# Patient Record
Sex: Male | Born: 1965 | Race: White | Hispanic: No | Smoking: Former smoker
Health system: Southern US, Community
[De-identification: ages and names within clinical notes are randomized; demographics above are authoritative.]

## PROBLEM LIST (undated history)

## (undated) DIAGNOSIS — F419 Anxiety disorder, unspecified: Secondary | ICD-10-CM

## (undated) DIAGNOSIS — M419 Scoliosis, unspecified: Secondary | ICD-10-CM

## (undated) DIAGNOSIS — I341 Nonrheumatic mitral (valve) prolapse: Secondary | ICD-10-CM

## (undated) DIAGNOSIS — T7840XA Allergy, unspecified, initial encounter: Secondary | ICD-10-CM

## (undated) DIAGNOSIS — F329 Major depressive disorder, single episode, unspecified: Secondary | ICD-10-CM

## (undated) DIAGNOSIS — F32A Depression, unspecified: Secondary | ICD-10-CM

## (undated) HISTORY — DX: Major depressive disorder, single episode, unspecified: F32.9

## (undated) HISTORY — DX: Depression, unspecified: F32.A

## (undated) HISTORY — DX: Allergy, unspecified, initial encounter: T78.40XA

## (undated) HISTORY — DX: Anxiety disorder, unspecified: F41.9

---

## 2001-10-09 ENCOUNTER — Encounter: Admission: RE | Admit: 2001-10-09 | Discharge: 2001-10-09 | Payer: Self-pay | Admitting: Gastroenterology

## 2001-10-09 ENCOUNTER — Encounter: Payer: Self-pay | Admitting: Gastroenterology

## 2001-10-19 ENCOUNTER — Encounter: Payer: Self-pay | Admitting: Emergency Medicine

## 2001-10-19 ENCOUNTER — Emergency Department (HOSPITAL_COMMUNITY): Admission: EM | Admit: 2001-10-19 | Discharge: 2001-10-19 | Payer: Self-pay | Admitting: Emergency Medicine

## 2002-08-18 ENCOUNTER — Ambulatory Visit (HOSPITAL_COMMUNITY): Admission: RE | Admit: 2002-08-18 | Discharge: 2002-08-18 | Payer: Self-pay | Admitting: Gastroenterology

## 2002-12-02 HISTORY — PX: SPINAL FUSION: SHX223

## 2003-11-09 ENCOUNTER — Inpatient Hospital Stay (HOSPITAL_COMMUNITY): Admission: RE | Admit: 2003-11-09 | Discharge: 2003-11-14 | Payer: Self-pay | Admitting: Orthopaedic Surgery

## 2004-03-21 ENCOUNTER — Emergency Department (HOSPITAL_COMMUNITY): Admission: EM | Admit: 2004-03-21 | Discharge: 2004-03-21 | Payer: Self-pay | Admitting: Emergency Medicine

## 2004-04-11 ENCOUNTER — Encounter: Admission: RE | Admit: 2004-04-11 | Discharge: 2004-07-10 | Payer: Self-pay | Admitting: Orthopaedic Surgery

## 2004-05-16 ENCOUNTER — Encounter: Admission: RE | Admit: 2004-05-16 | Discharge: 2004-08-14 | Payer: Self-pay | Admitting: Orthopaedic Surgery

## 2004-07-31 ENCOUNTER — Encounter: Admission: RE | Admit: 2004-07-31 | Discharge: 2004-10-29 | Payer: Self-pay | Admitting: Orthopaedic Surgery

## 2011-04-19 NOTE — Op Note (Signed)
NAME:  Edwin Goodwin, Edwin Goodwin                        ACCOUNT NO.:  [id number]   MEDICAL RECORD NO.:  [id number]                   PATIENT TYPE:  INP   LOCATION:  1610                                 FACILITY:   PHYSICIAN:  Sharolyn Douglas, M.D.                     DATE OF BIRTH:  [birth year]   DATE OF PROCEDURE:  11/09/2003  DATE OF DISCHARGE:                                 OPERATIVE REPORT   PREOPERATIVE DIAGNOSIS:  Idiopathic adult scoliosis.   POSTOPERATIVE DIAGNOSIS:  Idiopathic adult scoliosis.   OPERATION PERFORMED:  1. Posterior spinal fusion, T4 through L1.  2. Segmental spinal instrumentation, T4 through L1 utilizing hooks, screws     and wires from the [employer], stainless steel and Compass system.  3. Left posterior iliac crest bone graft supplemented with local autogenous     bone graft and allograft.  4. Neuro monitoring utilizing somatosensory evoked potentials and motor     evoked potentials.   SURGEON:  Sharolyn Douglas, M.D.   ASSISTANT:  Verlin Fester, P.A.   ANESTHESIA:  General endotracheal.   ESTIMATED BLOOD LOSS:  200 mL.   COMPLICATIONS:  None.   INDICATIONS FOR PROCEDURE:  The patient is a 45 year old male with a history  of idiopathic scoliosis.  He has had persistent pain along the thoracic  curve.  He has failed conservative treatment measures.  His radiographs show  a 50 plus degree right thoracic curve, measured from T4 to T12.  There was a  compensatory left lumbar curve.  Because of his ongoing pain and spinal  deformity, he has elected to undergo posterior spinal arthrodesis with  instrumentation and iliac crest bone graft in hopes of improving his  symptoms, partially correcting his deformity and preventing further  progression.  The risks, benefits, alternatives and complications of the  procedure were extensively discussed with the patient and he elected to  proceed.   DESCRIPTION OF PROCEDURE:  The patient was properly identified in the  holding  area and taken to the operating room.  He underwent general  endotracheal anesthesia.  He was given prophylactic IV antibiotics.  He was  carefully turned prone onto the Acromed 4-poster positioning frame.  All  bony prominences were padded.  Face and eyes protected at all times.  Back  prepped and draped in the usual sterile fashion.  An incision was made from  T3 to L2 in the midline. Dissection was carried down through the deep fascia  and the dorsal spine was exposed using subperiosteal dissection out to the  tips of the transverse processes from T4 down to L1.  The bony anatomy was  defined.  Retractors were placed.  We then removed the spinous processes  from T4 through T12 using the bone biting rongeur.  This bone was saved for  later autografting.  At this point we turned our attention to collecting  bone from the left  posterior iliac crest.  A separate incision was made.  The deep fascia was incised and the posterior superior iliac spine exposed.  A Leksell rongeur was used to remove the cap of the __________.  We then  removed copious amounts of cancellous bone from between the iliac cables  using curets and gouges.  The deep fascia was then repaired with #1 Vicryl.  The subcutaneous layer closed with 2-0 Vicryl and the skin closed with a  running 3-0 subcuticular Vicryl suture.  Benzoin and Steri-Strips were  placed.  We then turned our attention back to the midline incision.  We  performed facetectomies bilaterally from T4 through the T12-L1 facet joints.  The facet joints were burred.  We then placed pedicle screws at T12 and L1  bilaterally. We used 6.5 x 40 mm screws at T12 bilaterally.  At L1 we used a  6.5 by 40 screw on the left and a 5.5 x 40 screw on the right.  We then  placed hooks over the transverse processes of T4 bilaterally in a down going  fashion.  Pedicle hooks were placed at T5 bilaterally.  A periapical claw  construct was created at T8-9 on the right side  using up and downgoing  transverse process hooks.  We then cut a rod to length and carefully bent it  into normal sagittal plane kyphosis and lordosis.  We then turned our  attention to preparing laminotomies at T6-7, T7-8, T8-9 and T9-10 in order  to place sublaminar wires.  Four sublaminar wires were passed in the apical  region of the curve.  Great care was taken to protect the underlying spinal  cord.  We monitored somatosensory evoked potentials and motor evoked  potentials throughout the procedure.  There were no deleterious changes.  We  then placed the left-sided rod into the claw construct proximally and  attached it to the pedicle screws distally.  The rod was rotated in the  normal sagittal plane alignment.  We then tightened the sublaminar wires,  obtaining a good reduction of the thoracic curve.  We then placed the right-  sided rod after bending it again into the normal sagittal plane alignment.  The claw constructs were seated proximally.  We applied compression about  the right-sided periapical claw.  We distracted and compressed on the  pedicle screws to level the caudal foundation.  We placed cross connectors  proximally and distally.  At this point we turned our attention to  performing a posterior spinal arthrodesis, T4 through L1.  The lamina and  transverse processes were decorticated using the high speed bur. The  posterior iliac crest bone graft was packed into the facet joints at each  level.  The local autogenous bone graft which had been cleaned and  morselized was then placed over the lamina.  We then supplemented this with  100 mL of allograft bone croutons.  AP and lateral intraoperative x-ray  showed appropriate positioning of instrumentation and reduction of the  deformity. We then closed the deep layer using a #1 Vicryl suture,  subcutaneous layer closed with 2-0 Vicryl followed by a running 3-0 subcuticular Vicryl suture to approximate the skin.  A  superficial Hemovac  drain was placed.  Benzoin and Steri-Strips applied.  Sterile dressing  placed over the patient's wound.  He was turned supine, extubated without  difficulty and transferred to the recovery room in stable condition able to  move his upper and lower extremities.  It should be noted  that there were no  deleterious changes in the SSEP's or motor evoked potentials throughout the  procedure.                                               Sharolyn Douglas, M.D.    MC/MEDQ  D:  11/10/2003  T:  11/11/2003  Job:  [id number]

## 2011-04-19 NOTE — Op Note (Signed)
   NAME:  Edwin Goodwin, Edwin Goodwin                        ACCOUNT NO.:  [id number]   MEDICAL RECORD NO.:  [id number]                   PATIENT TYPE:  AMB   LOCATION:  ENDO                                 FACILITY:  [facility]   PHYSICIAN:  Barrie Folk, M.D.                  DATE OF BIRTH:  12-07-1965   DATE OF PROCEDURE:  08/18/2002  DATE OF DISCHARGE:                                 OPERATIVE REPORT   PROCEDURE:  Colonoscopy.   INDICATIONS FOR PROCEDURE:  Heme positive stool discovered during evaluation  for chronic constipation.   DESCRIPTION OF PROCEDURE:  The patient was placed in the left lateral  decubitus position then placed on the pulse monitor with continuous low flow  oxygen delivered by nasal cannula. He was sedated with 100 mg IV Demerol and  10 mg IV Versed. The Olympus video colonoscope was inserted into the rectum  and advanced to the cecum, confirmed by transillumination at McBurney's  point and visualization of the ileocecal valve and appendiceal orifice. The  prep was generally good but suboptimal in the ascending colon and cecum and  I could not rule out small lesions less than 1 cm in all areas. Otherwise,  the cecum, ascending, transverse, descending and sigmoid colon all appeared  normal with no masses, polyps, diverticula or other mucosal abnormalities.  The rectum likewise appeared normal and retroflexed view of the anus  revealed some small internal hemorrhoids. The colonoscope was then withdrawn  and the patient returned to the recovery room in stable condition. The  patient tolerated the procedure well and there were no immediate  complications.   IMPRESSION:  Internal hemorrhoids, otherwise, normal colonoscopy.   PLAN:  Begin a regimen of Zelnorm and tailored dose of MiraLax for his  chronic constipation and follow-up in the office in a few weeks.                                               Barrie Folk, M.D.    JCH/MEDQ  D:  08/18/2002  T:  08/19/2002   Job:  [id number]

## 2011-04-19 NOTE — Discharge Summary (Signed)
NAME:  Edwin Goodwin, Edwin Goodwin                        ACCOUNT NO.:  [id number]   MEDICAL RECORD NO.:  [id number]                   PATIENT TYPE:  INP   LOCATION:  5030                                 FACILITY:  [facility]   PHYSICIAN:  Sharolyn Douglas, M.D.                     DATE OF BIRTH:  16-Sep-1966   DATE OF ADMISSION:  11/09/2003  DATE OF DISCHARGE:  11/14/2003                                 DISCHARGE SUMMARY   ADMISSION DIAGNOSES:  1. Idiopathic scoliosis.  2. Irritable bowel syndrome.  3. Mitral valve prolapse.  4. History of urinary tract infections.  5. Panic disorder.   DISCHARGE DIAGNOSES:  1. Status post T4 to L1 posterior spinal fusion with pedicle screws and left     posterior iliac crest bone grafting.  2. Low-grade temperature postoperatively.  3. Postoperative hemorrhagic anemia which was asymptomatic and did not     require transfusions.  4. Idiopathic scoliosis.  5. Irritable bowel syndrome.  6. Mitral valve prolapse.  7. History of urinary tract infections.  8. Panic disorder.   PROCEDURE:  1. Posterior spinal fusion from T4 to L1 with pedicle screws, hooks, and     wires.  2. Left posterior iliac crest bone grafting.  Surgeon was Sharolyn Douglas, MD.     Assistant was [facility], PA-C.  General anesthesia was used.   CONSULTS:  None.   LABS:  Preoperative labs, CBC with diff was within normal limits with the  exception of platelets of 409,000, slightly elevated.  PT/INR and PTT within  normal limits.  Complete metabolic panel within normal limits with the  exception of glucose of 121.  UA was negative.  Blood typing was type A, Rh  positive, antibody screen negative.  H&H were monitored postoperatively  throughout the hospital stay and reached a low of 9.3 and 27.9 respectively;  however, was asymptomatic and did not require transfusion.  Basic metabolic  panel postoperatively within normal limits with the exception of slight  elevation of glucose at 163 and 129 on  December and December 10  respectively.  Potassium was slightly decreased at 3.3 on December 10.   X-RAYS:  December 8 portable x-rays were used for localization.  On December  13 thoracolumbar x-ray showed T4 to L1 posterior spinal fusion and good  position and alignment.  EKG from December 3 showed sinus tachycardia,  otherwise normal ready by Nicki Guadalajara.   BRIEF HISTORY:  The patient is a 45 year old male who has had idiopathic  scoliosis since he was an adolescent.  Unfortunately, he has developed some  significant pain in his back from this dysfunction over the last few years.  The treatment as an adolescent included bracing from the age of 76 to 53.  Recently he has also developed severe headaches with migraines and has tried  numerous medications.  Also was seen by a specialist to help relieve these  symptoms.  At this point he is having aching pain that involves his head as  well as his entire back and spine area.  Primary focus of the back pain is  the right thoracic area.  He denies any tingling or numbness.  Because of  these findings as well as his x-ray findings, it was felt that his best  course of management would be posterior spinal fusion for his scoliosis.  The risks and benefits of the procedure were discussed with the patient and  the patient's family by Dr. Noel Gerold as well as myself.  They indicated  understanding and opted to proceed with the surgery.   HOSPITAL COURSE:  On November 09, 2003, the patient was taken to the  operating room for the above-listed procedure. He tolerated the procedure  well without any intraoperative complications. There was approximately 200  mL of blood loss. One Hemovac drain was placed intraoperatively.  He was  transferred to the recovery room in stable condition.   Postoperatively routine orthopedic spine protocol was followed. He  progressed well.  Physical therapy and occupational therapy began seeing him  and getting him out of  bed the first day postoperatively.  He did well with  them, although his progress was slow initially.  Over postoperative day 3 to  4, though his progress was excellent with the therapist.  He did develop a  low-grade temperature on postoperative day 2 of 100.1.  This resolved with  increased activity and increased use of incentive spirometer.  He did  develop anemia reaching a low of 9.3 as previously dictated; however, he was  asymptomatic and did not require any blood transfusion for this.  Discharge  planners were consulted as well to see the patient and help [place] in arranging  home needs as well as home health physical therapy and occupational therapy.   By November 14, 2003, the patient had met all orthopedic goals.  He was  stable and safe per the therapists.  He was medically stable and ready for  discharge.  His vital signs  were stable.  He was afebrile.  Motor and  sensation were intact.  Incisions looked great without any signs or symptoms  of infection.  His abdomen was soft, nontender, nondistended.   DISCHARGE PLAN:  The patient is a 45 year old male status post T4 to L1  posterior spinal fusion.  Activity is daily ambulation with brace to be used  on an as-needed basis only.  Back precautions were discussed and understood.  He should avoid any lifting greater than 5 pounds.  He may shower.  Follow  up in 2 weeks with Dr. Noel Gerold.  Instructed to call for an appointment.   DISCHARGE MEDICATIONS:  Mepergan, Robaxin, over-the-counter laxative as  needed, stool softener as needed, calcium daily, and multivitamin daily.   DIET:  Regular home diet.   CONDITION ON DISCHARGE:  Stable, improved.   DISPOSITION:  The patient is being discharged to his home with his family's  assistance as well as home health, physical therapy, and occupational  therapy per [place].      Verlin Fester, P.A.                       Sharolyn Douglas, M.D.   CM/MEDQ  D:  12/21/2003  T:  12/21/2003  Job:   [id number]

## 2011-04-19 NOTE — H&P (Signed)
NAME:  Edwin Goodwin, Edwin Goodwin                        ACCOUNT NO.:  [id number]   MEDICAL RECORD NO.:  [id number]                   PATIENT TYPE:  INP   LOCATION:  6704                                 FACILITY:  [facility]   PHYSICIAN:  Sharolyn Douglas, M.D.                     DATE OF BIRTH:  December 12, 1965   DATE OF ADMISSION:  11/09/2003  DATE OF DISCHARGE:                                HISTORY & PHYSICAL   CHIEF COMPLAINT:  Scoliosis with back pain.   HISTORY OF PRESENT ILLNESS:  The patient is a 45 year old male who has had  idiopathic scoliosis since he was an adolescent.  Unfortunately, he has  developed some significant pain in his back that is limiting his function  and activities over the last few years.  Treatments as an adolescent  included bracing from the age of 65 to 55.  Recently, he has developed  severe migraine headaches and tried numerous medications, and also seen by a  specialist for this, and he has not had any relief of these symptoms.  He  has an aching pain that starts in his head and also involves his entire  spine and back area.  The primary focus of the back pain is the right  thoracic area.  He denies any tingling or numbness.  Secondary to these  findings and he has failed all conservative treatments, it is felt that his  best course of management would be a posterior spinal fusion for scoliosis.  Risks and benefits of this procedure were discussed with the patient and his  family by Dr. Noel Gerold as well as myself.  He indicated understanding and opted  to proceed.   ALLERGIES:  No known drug allergies.   MEDICATIONS:  1. Lexapro 30 mg q. day.  2. Wellbutrin XL 300 q. day.  3. Xanax 1.5 q. day p.r.n.  4. Promethazine 25 mg q.6h. p.r.n. nausea.  5. Levsin 10 mg p.r.n. stomach cramps.  6. MiraLax 17 g q. day for constipation.  7. Vicodin p.r.n.   PAST MEDICAL HISTORY:  1. Irritable bowel syndrome.  2. Mitral valve prolapse.  3. 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  4. History of panic disorders and anxiety.  5. Migraines.   PAST SURGICAL HISTORY:  None.   SOCIAL HISTORY:  The patient denies tobacco use, denies alcohol use.  He is  married.  Has one 45-year-old son.  His mother and his wife will be available  to help assist in the postoperative course.   FAMILY HISTORY:  Significant for coronary artery disease, diabetes, cancer,  stroke, and osteoarthritis.   REVIEW OF SYSTEMS:  The patient denies fevers, chills, sweats, or bleeding  tendencies.  CNS:  Denies blurry vision, double vision, seizures, headaches,  paralysis.  CARDIOVASCULAR:  Denies chest pain, angina, orthopnea,  claudication, or palpitations.  PULMONARY:  Denies shortness of breath,  productive cough, or hemoptysis.  GASTROINTESTINAL:  Denies nausea,  vomiting, does have constipation from pain medications.  Denies melena or  blood in stool.  GENITOURINARY:  Denies dysuria, hematuria, discharge.  MUSCULOSKELETAL:  As per HPI.   PHYSICAL EXAMINATION:  VITAL SIGNS:  Pulse is 88 and regular, respirations  16 and unlabored.  GENERAL APPEARANCE:  The patient is a 45 year old white male who is alert  and oriented, in no acute distress.  He is well-developed, well-groomed,  appears to be stated age.  Pleasant and cooperative to examiner.  HEENT:  Head is normocephalic, atraumatic.  Pupils equal, round, reactive to  light.  Extraocular movements were intact.  Nares patent.  Pharynx clear.  NECK:  Soft to palpation, no lymphadenopathy, thyromegaly, or bruits  appreciated.  CHEST:  Clear to auscultation bilaterally.  No rales, rhonchi, stridor,  wheezes, or friction rubs present.  HEART:  S1 and S2, regular rate and rhythm, no murmurs, rubs, or gallops  noted today.  ABDOMEN:  Soft to palpation, nontender, nondistended, no organomegaly noted.  Positive bowel sounds throughout.  GENITOURINARY:  Not pertinent, not performed.  EXTREMITIES:  Lower function and sensation are  grossly intact.  Pulses are  intact and symmetric bilaterally.  Calves are soft and nontender  bilaterally.  SKIN:  Intact without any rashes or lesions.   LABORATORY DATA:  X-ray and MRI show scoliosis.   IMPRESSION:  1. Scoliosis.  2. Irritable bowel syndrome.  3. Mitral valve prolapse.  4. History of urinary tract infection.  5. Panic disorder.   PLAN:  Admit to [facility] on November 09, 2003, for a T4 to L1  posterior spinal fusion utilizing pedicle  screws, hooks, and wires.  This will be done by Dr. Sharolyn Douglas.      Verlin Fester, P.A.                       Sharolyn Douglas, M.D.    CM/MEDQ  D:  11/09/2003  T:  11/09/2003  Job:  [id number]

## 2011-10-14 ENCOUNTER — Emergency Department (INDEPENDENT_AMBULATORY_CARE_PROVIDER_SITE_OTHER)
Admission: EM | Admit: 2011-10-14 | Discharge: 2011-10-14 | Disposition: A | Discharge status: Home / Self Care | Payer: Managed Care, Other (non HMO) | Source: Home / Self Care | Attending: Emergency Medicine | Admitting: Emergency Medicine

## 2011-10-14 ENCOUNTER — Encounter: Payer: Self-pay | Admitting: *Deleted

## 2011-10-14 DIAGNOSIS — J069 Acute upper respiratory infection, unspecified: Secondary | ICD-10-CM

## 2011-10-14 HISTORY — DX: Nonrheumatic mitral (valve) prolapse: I34.1

## 2011-10-14 HISTORY — DX: Scoliosis, unspecified: M41.9

## 2011-10-14 MED ORDER — AMOXICILLIN 875 MG PO TABS: 875.0000 mg | ORAL_TABLET | Freq: Two times a day (BID) | ORAL | Status: AC

## 2011-10-14 NOTE — ED Provider Notes (Signed)
History     CSN: [id number] Arrival date & time: 10/14/2011  8:14 AM   None     No chief complaint on file.   (Consider location/radiation/quality/duration/timing/severity/associated sxs/prior treatment) HPI Rohit is a 45 y.o. male who complains of onset of cold symptoms for 5 days and still getting worse.  They are using OTC cold meds which helps a little bit. No sore throat + cough No pleuritic pain No wheezing +nasal congestion + post-nasal drainage + sinus pain/pressure No chest congestion No itchy/red eyes No earache No hemoptysis No SOB +chills/sweats No fever No nausea No vomiting No abdominal pain No diarrhea No skin rashes + fatigue No myalgias + headache    No past medical history on file.  No past surgical history on file.  No family history on file.  History  Substance Use Topics  . Smoking status: Not on file  . Smokeless tobacco: Not on file  . Alcohol Use: Not on file      Review of Systems  Allergies  Review of patient's allergies indicates not on file.  Home Medications  No current outpatient prescriptions on file.  There were no vitals taken for this visit.  Physical Exam  Nursing note and vitals reviewed. Constitutional: He is oriented to person, place, and time. He appears well-developed and well-nourished.  HENT:  Head: Normocephalic and atraumatic.  Right Ear: Tympanic membrane, external ear and ear canal normal.  Left Ear: Tympanic membrane, external ear and ear canal normal.  Nose: Mucosal edema and rhinorrhea present.  Mouth/Throat: Posterior oropharyngeal erythema present. No oropharyngeal exudate or posterior oropharyngeal edema.  Neck: Neck supple.  Cardiovascular: Regular rhythm and normal heart sounds.   Pulmonary/Chest: Effort normal and breath sounds normal. No respiratory distress.  Neurological: He is alert and oriented to person, place, and time.  Skin: Skin is warm and dry.  Psychiatric: He has a normal  mood and affect. His speech is normal.    ED Course  Procedures (including critical care time)  Labs Reviewed - No data to display No results found.   No diagnosis found.    MDM  Viral URI   1)  Take the prescribed antibiotic as instructed.  Can hold a few days prior to taking. 2)  Use nasal saline solution (over the counter) at least 3 times a day. 3)  Use over the counter decongestants like Zyrtec-D every 12 hours as needed to help with congestion.  If you have hypertension, do not take medicines with sudafed.  4)  Can take tylenol every 6 hours or motrin every 8 hours for pain or fever. 5)  Follow up with your primary doctor if no improvement in 5-7 days, sooner if increasing pain, fever, or new symptoms.     Lily Kocher, MD 10/14/11 [phone]

## 2015-01-26 ENCOUNTER — Ambulatory Visit (INDEPENDENT_AMBULATORY_CARE_PROVIDER_SITE_OTHER): Payer: BLUE CROSS/BLUE SHIELD | Admitting: Physician Assistant

## 2015-01-26 VITALS — BP 118/70 | HR 100 | Temp 98.4°F | Resp 18 | Ht 69.0 in | Wt 165.0 lb

## 2015-01-26 DIAGNOSIS — G43909 Migraine, unspecified, not intractable, without status migrainosus: Secondary | ICD-10-CM | POA: Insufficient documentation

## 2015-01-26 DIAGNOSIS — G8929 Other chronic pain: Secondary | ICD-10-CM | POA: Insufficient documentation

## 2015-01-26 DIAGNOSIS — M549 Dorsalgia, unspecified: Secondary | ICD-10-CM

## 2015-01-26 DIAGNOSIS — I341 Nonrheumatic mitral (valve) prolapse: Secondary | ICD-10-CM | POA: Insufficient documentation

## 2015-01-26 DIAGNOSIS — J069 Acute upper respiratory infection, unspecified: Secondary | ICD-10-CM

## 2015-01-26 MED ORDER — IPRATROPIUM BROMIDE 0.03 % NA SOLN: 2.0000 | Freq: Two times a day (BID) | NASAL | Status: AC

## 2015-01-26 MED ORDER — GUAIFENESIN ER 1200 MG PO TB12: 1.0000 | ORAL_TABLET | Freq: Two times a day (BID) | ORAL | Status: AC | PRN

## 2015-01-26 NOTE — Patient Instructions (Signed)
Get plenty of rest and drink at least 64 ounces of water daily. 

## 2015-01-26 NOTE — Progress Notes (Signed)
  Medical screening examination/treatment/procedure(s) were performed by non-physician practitioner and as supervising physician I was immediately available for consultation/collaboration.     

## 2015-01-26 NOTE — Progress Notes (Signed)
Subjective:   Patient ID: Edwin Goodwin, male     DOB: 1966/03/23, 49 y.o.    MRN: [id number]  PCP: No primary care provider on file.  Chief Complaint  Patient presents with  . Sore Throat    yesterday   . Nasal Congestion  . clogged ears    this morning   . Headache   Prior to Admission medications   Medication Sig Start Date End Date Taking? Authorizing Provider  escitalopram (LEXAPRO) 20 MG tablet Take 20 mg by mouth daily.   Yes Historical Provider, MD  HYDROcodone-acetaminophen (NORCO/VICODIN) 5-325 MG per tablet  01/05/15  Yes Historical Provider, MD  tiZANidine (ZANAFLEX) 4 MG tablet as needed. 12/05/14  Yes Historical Provider, MD    No Known Allergies  Patient Active Problem List   Diagnosis Date Noted  . Migraine headache 01/26/2015  . Chronic back pain 01/26/2015  . Mitral valve prolapse 01/26/2015    Family History  Problem Relation Age of Onset  . Hyperlipidemia Father   . Hypertension Father   . Hyperlipidemia Mother   . Mental illness Mother     depression  . Parkinson's disease Mother   . Osteoporosis Mother   . Arthritis Mother   . Diabetes Brother   . Hyperlipidemia Brother   . Hypertension Brother   . Mental illness Brother     disabled due to anxiety, possible Bipolar disorder  . Irritable bowel syndrome Son     History   Social History  . Marital Status: Divorced    Spouse Name: n/a  . Number of Children: 1  . Years of Education: 12   Occupational History  . IT Professional    Social History Main Topics  . Smoking status: Former Smoker    Types: Pipe  . Smokeless tobacco: Not on file     Comment: VAPE-uses for migraine prevention and treatment  . Alcohol Use: No  . Drug Use: No  . Sexual Activity: Not on file   Other Topics Concern  . Not on file   Social History Narrative   Former singer, rock-n-roll band.   Artist.   Divorced. Son lives with him (summer visits his mother and older half-brother in [place]).         HPI  I feel like crud, in general. No more achy than usual. HA feels like it's his sinuses, not a migraine. OTC cold/sinus medication yesterday helped, but not today.  Mild fever yesterday. No nausea, vomiting or diarrhea.  Review of Systems ROS      Objective:  Physical Exam Physical Exam  Constitutional: He is oriented to person, place, and time. Vital signs are normal. He appears well-developed and well-nourished. He is active and cooperative. No distress.  BP 118/70 mmHg  Pulse 100  Temp(Src) 98.4 F (36.9 C) (Oral)  Resp 18  Ht  (1.753 m)  Wt 165 lb (74.844 kg)  BMI 24.36 kg/m2  SpO2 97%  HENT:  Head: Normocephalic and atraumatic.  Right Ear: Hearing, external ear and ear canal normal. Tympanic membrane is retracted (mildly).  Left Ear: Hearing, tympanic membrane, external ear and ear canal normal.  Nose: Right sinus exhibits frontal sinus tenderness (mild). Right sinus exhibits no maxillary sinus tenderness. Left sinus exhibits no maxillary sinus tenderness and no frontal sinus tenderness.  Mouth/Throat: Uvula is midline and oropharynx is clear and moist.  Eyes: Conjunctivae, EOM and lids are normal. Pupils are equal, round, and reactive to light. No scleral icterus.  Neck: Normal range of motion and phonation normal. Neck supple. No thyromegaly present.  Cardiovascular: Normal rate, regular rhythm and normal heart sounds.   Pulses:      Radial pulses are 2+ on the right side, and 2+ on the left side.  Pulmonary/Chest: Effort normal and breath sounds normal.  Lymphadenopathy:       Head (right side): No tonsillar, no preauricular, no posterior auricular and no occipital adenopathy present.       Head (left side): No tonsillar, no preauricular, no posterior auricular and no occipital adenopathy present.    He has no cervical adenopathy.       Right: No supraclavicular adenopathy present.       Left: No supraclavicular adenopathy present.  Neurological:  He is alert and oriented to person, place, and time. No cranial nerve deficit or sensory deficit.  Skin: Skin is warm, dry and intact. No rash noted. No cyanosis or erythema. Nails show no clubbing.  Psychiatric: He has a normal mood and affect. His speech is normal and behavior is normal.             Assessment & Plan:  1. Viral URI Supportive care.  Anticipatory guidance.  RTC if symptoms worsen/persist. - ipratropium (ATROVENT) 0.03 % nasal spray; Place 2 sprays into both nostrils 2 (two) times daily.  Dispense: 30 mL; Refill: 0 - Guaifenesin (MUCINEX MAXIMUM STRENGTH) 1200 MG TB12; Take 1 tablet (1,200 mg total) by mouth every 12 (twelve) hours as needed.  Dispense: 14 tablet; Refill: 1   Fernande Brashelle S. Briella Hobday, PA-C Physician Assistant-Certified [facility]

## 2015-01-28 ENCOUNTER — Telehealth: Payer: Self-pay

## 2015-01-28 NOTE — Telephone Encounter (Signed)
Patient said he was told by Chelle to call for additional prescriptions if his symptoms were worsening.  He said that he feels much worse than he did a couple days ago.  He saw Chelle on 01/26/15 and was originally prescribed Mucinex and Atrovent.  CB#: [phone]

## 2015-01-30 MED ORDER — AMOXICILLIN-POT CLAVULANATE 875-125 MG PO TABS: 1.0000 | ORAL_TABLET | Freq: Two times a day (BID) | ORAL | Status: AC

## 2015-01-30 NOTE — Telephone Encounter (Signed)
Pt called again this morning stating he is feeling worse and was told we would call him in an antibiotics and he also have a developed a cough Please call [phone]    [facility] [address]

## 2015-01-30 NOTE — Telephone Encounter (Signed)
Spoke with pt, advised Rx sent to the pharmacy. 

## 2015-01-30 NOTE — Telephone Encounter (Signed)
Meds ordered this encounter  Medications  . amoxicillin-clavulanate (AUGMENTIN) 875-125 MG per tablet    Sig: Take 1 tablet by mouth 2 (two) times daily.    Dispense:  20 tablet    Refill:  0    Order Specific Question:  Supervising Provider    Answer:  DOOLITTLE, ROBERT P [[id number]]    

## 2015-01-30 NOTE — Telephone Encounter (Signed)
1. Viral URI   Supportive care. Anticipatory guidance. RTC if symptoms worsen/persist. - ipratropium (ATROVENT) 0.03 % nasal spray; Place 2 sprays into both nostrils 2 (two) times daily. Dispense: 30 mL; Refill: 0 - Guaifenesin (MUCINEX MAXIMUM STRENGTH) 1200 MG TB12; Take 1 tablet (1,200 mg total) by mouth every 12 (twelve) hours as needed. Dispense: 14 tablet; Refill: 1  Chelle no ABX in your plan if worsening. Please advise.

## 2016-03-04 DIAGNOSIS — F331 Major depressive disorder, recurrent, moderate: Secondary | ICD-10-CM | POA: Diagnosis not present

## 2016-03-11 DIAGNOSIS — F331 Major depressive disorder, recurrent, moderate: Secondary | ICD-10-CM | POA: Diagnosis not present

## 2016-03-20 DIAGNOSIS — F331 Major depressive disorder, recurrent, moderate: Secondary | ICD-10-CM | POA: Diagnosis not present

## 2016-03-25 DIAGNOSIS — F331 Major depressive disorder, recurrent, moderate: Secondary | ICD-10-CM | POA: Diagnosis not present

## 2016-04-01 DIAGNOSIS — F331 Major depressive disorder, recurrent, moderate: Secondary | ICD-10-CM | POA: Diagnosis not present

## 2016-04-09 DIAGNOSIS — F331 Major depressive disorder, recurrent, moderate: Secondary | ICD-10-CM | POA: Diagnosis not present

## 2016-04-15 DIAGNOSIS — F331 Major depressive disorder, recurrent, moderate: Secondary | ICD-10-CM | POA: Diagnosis not present

## 2016-04-23 DIAGNOSIS — F331 Major depressive disorder, recurrent, moderate: Secondary | ICD-10-CM | POA: Diagnosis not present

## 2016-05-01 DIAGNOSIS — F331 Major depressive disorder, recurrent, moderate: Secondary | ICD-10-CM | POA: Diagnosis not present

## 2016-05-07 DIAGNOSIS — F331 Major depressive disorder, recurrent, moderate: Secondary | ICD-10-CM | POA: Diagnosis not present

## 2016-05-14 DIAGNOSIS — F331 Major depressive disorder, recurrent, moderate: Secondary | ICD-10-CM | POA: Diagnosis not present

## 2016-05-20 DIAGNOSIS — F331 Major depressive disorder, recurrent, moderate: Secondary | ICD-10-CM | POA: Diagnosis not present

## 2016-06-12 DIAGNOSIS — F331 Major depressive disorder, recurrent, moderate: Secondary | ICD-10-CM | POA: Diagnosis not present

## 2016-10-08 DIAGNOSIS — M545 Low back pain: Secondary | ICD-10-CM | POA: Diagnosis not present

## 2016-10-08 DIAGNOSIS — F419 Anxiety disorder, unspecified: Secondary | ICD-10-CM | POA: Diagnosis not present

## 2016-10-08 DIAGNOSIS — F324 Major depressive disorder, single episode, in partial remission: Secondary | ICD-10-CM | POA: Diagnosis not present

## 2017-04-08 DIAGNOSIS — G894 Chronic pain syndrome: Secondary | ICD-10-CM | POA: Diagnosis not present

## 2017-05-22 DIAGNOSIS — J0100 Acute maxillary sinusitis, unspecified: Secondary | ICD-10-CM | POA: Diagnosis not present

## 2017-09-15 DIAGNOSIS — J069 Acute upper respiratory infection, unspecified: Secondary | ICD-10-CM | POA: Diagnosis not present

## 2017-10-17 DIAGNOSIS — E7800 Pure hypercholesterolemia, unspecified: Secondary | ICD-10-CM | POA: Diagnosis not present

## 2017-10-17 DIAGNOSIS — F419 Anxiety disorder, unspecified: Secondary | ICD-10-CM | POA: Diagnosis not present

## 2017-10-17 DIAGNOSIS — Z0000 Encounter for general adult medical examination without abnormal findings: Secondary | ICD-10-CM | POA: Diagnosis not present

## 2017-10-17 DIAGNOSIS — Z125 Encounter for screening for malignant neoplasm of prostate: Secondary | ICD-10-CM | POA: Diagnosis not present

## 2017-10-17 DIAGNOSIS — M545 Low back pain: Secondary | ICD-10-CM | POA: Diagnosis not present

## 2017-10-17 DIAGNOSIS — F324 Major depressive disorder, single episode, in partial remission: Secondary | ICD-10-CM | POA: Diagnosis not present

## 2017-12-16 DIAGNOSIS — J069 Acute upper respiratory infection, unspecified: Secondary | ICD-10-CM | POA: Diagnosis not present

## 2018-01-07 DIAGNOSIS — M5412 Radiculopathy, cervical region: Secondary | ICD-10-CM | POA: Diagnosis not present

## 2018-01-10 ENCOUNTER — Other Ambulatory Visit: Payer: Self-pay | Admitting: Family Medicine

## 2018-01-10 DIAGNOSIS — M5412 Radiculopathy, cervical region: Secondary | ICD-10-CM

## 2018-01-26 ENCOUNTER — Ambulatory Visit
Admission: RE | Admit: 2018-01-26 | Discharge: 2018-01-26 | Disposition: A | Discharge status: Home / Self Care | Payer: BLUE CROSS/BLUE SHIELD | Source: Ambulatory Visit | Attending: Family Medicine | Admitting: Family Medicine

## 2018-01-26 DIAGNOSIS — M5412 Radiculopathy, cervical region: Secondary | ICD-10-CM

## 2018-01-26 DIAGNOSIS — M542 Cervicalgia: Secondary | ICD-10-CM | POA: Diagnosis not present

## 2018-03-12 DIAGNOSIS — M47892 Other spondylosis, cervical region: Secondary | ICD-10-CM | POA: Diagnosis not present

## 2018-03-12 DIAGNOSIS — M542 Cervicalgia: Secondary | ICD-10-CM | POA: Diagnosis not present

## 2018-05-26 DIAGNOSIS — G894 Chronic pain syndrome: Secondary | ICD-10-CM | POA: Diagnosis not present

## 2018-05-26 DIAGNOSIS — F324 Major depressive disorder, single episode, in partial remission: Secondary | ICD-10-CM | POA: Diagnosis not present

## 2018-05-26 DIAGNOSIS — M545 Low back pain: Secondary | ICD-10-CM | POA: Diagnosis not present

## 2018-05-26 DIAGNOSIS — F419 Anxiety disorder, unspecified: Secondary | ICD-10-CM | POA: Diagnosis not present

## 2018-07-14 DIAGNOSIS — B078 Other viral warts: Secondary | ICD-10-CM | POA: Diagnosis not present

## 2018-07-14 DIAGNOSIS — F4321 Adjustment disorder with depressed mood: Secondary | ICD-10-CM | POA: Diagnosis not present

## 2018-07-14 DIAGNOSIS — G894 Chronic pain syndrome: Secondary | ICD-10-CM | POA: Diagnosis not present

## 2018-08-07 DIAGNOSIS — F324 Major depressive disorder, single episode, in partial remission: Secondary | ICD-10-CM | POA: Diagnosis not present

## 2018-08-07 DIAGNOSIS — G894 Chronic pain syndrome: Secondary | ICD-10-CM | POA: Diagnosis not present

## 2018-08-07 DIAGNOSIS — J0110 Acute frontal sinusitis, unspecified: Secondary | ICD-10-CM | POA: Diagnosis not present

## 2018-12-08 DIAGNOSIS — F324 Major depressive disorder, single episode, in partial remission: Secondary | ICD-10-CM | POA: Diagnosis not present

## 2018-12-08 DIAGNOSIS — G894 Chronic pain syndrome: Secondary | ICD-10-CM | POA: Diagnosis not present

## 2018-12-08 DIAGNOSIS — E7800 Pure hypercholesterolemia, unspecified: Secondary | ICD-10-CM | POA: Diagnosis not present

## 2018-12-08 DIAGNOSIS — Z125 Encounter for screening for malignant neoplasm of prostate: Secondary | ICD-10-CM | POA: Diagnosis not present

## 2018-12-08 DIAGNOSIS — F419 Anxiety disorder, unspecified: Secondary | ICD-10-CM | POA: Diagnosis not present

## 2019-01-08 IMAGING — MR MR CERVICAL SPINE W/O CM
4 of 5 series · 25 of 48 positions shown · non-contrast
Comparison: None.

CLINICAL DATA: Neck pain radiating into both shoulders and
posterior head for 3 months. No known injury. History of
thoracolumbar fusion for scoliosis.

EXAM:
MRI CERVICAL SPINE WITHOUT CONTRAST
TECHNIQUE: Multiplanar, multisequence MR imaging of the cervical spine was
performed. No intravenous contrast was administered.

[Series 6: GRE · axial · 3.0mm · 0.35mm/px · z∈[-87,-2]mm · 4 of 30 slices shown]
[im 1/30]
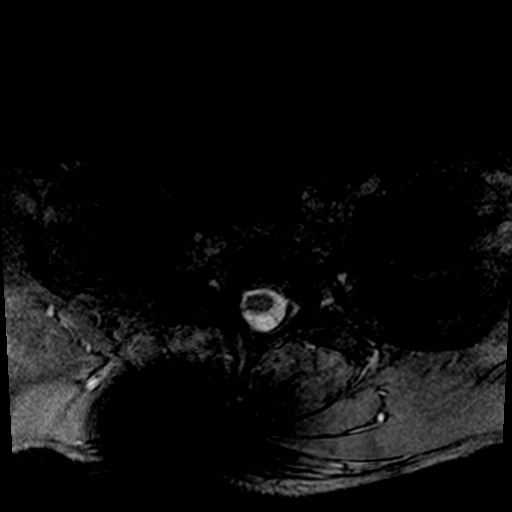
[im 5/30]
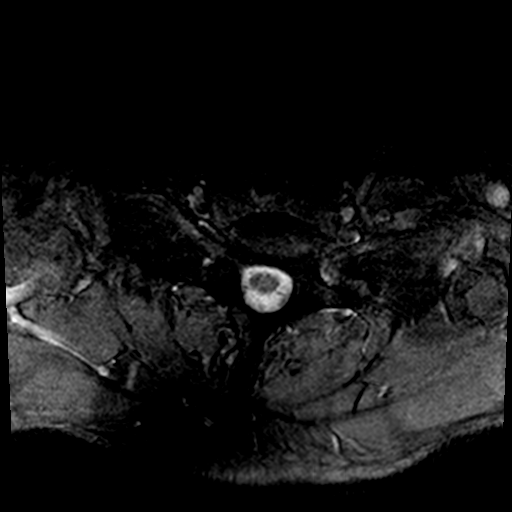
[im 15/30]
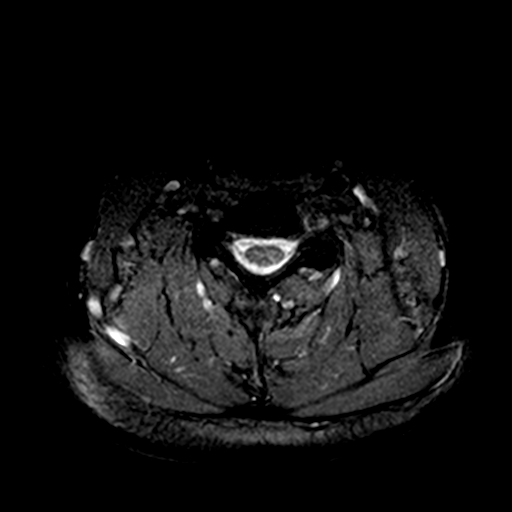
[im 25/30]
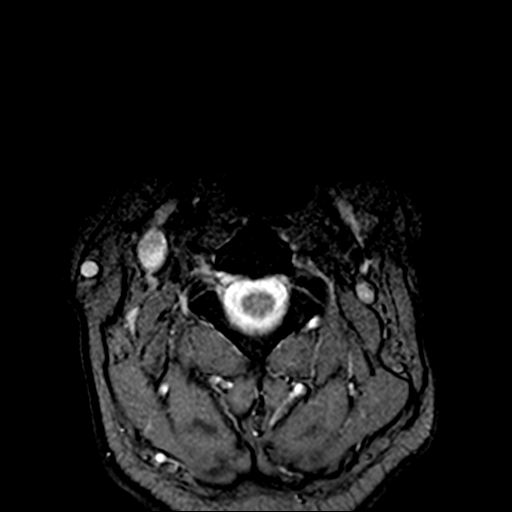

[Series 7: T2 · sagittal · 3.3mm · 0.35mm/px · 6 of 13 slices shown (1 of 2)]
[im 1/13]
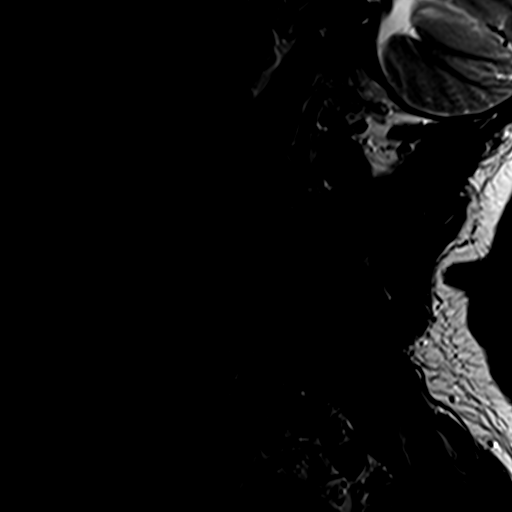
[im 3/13]
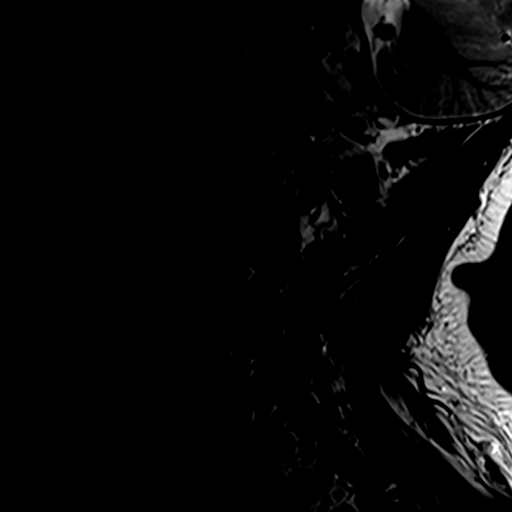
[im 5/13]
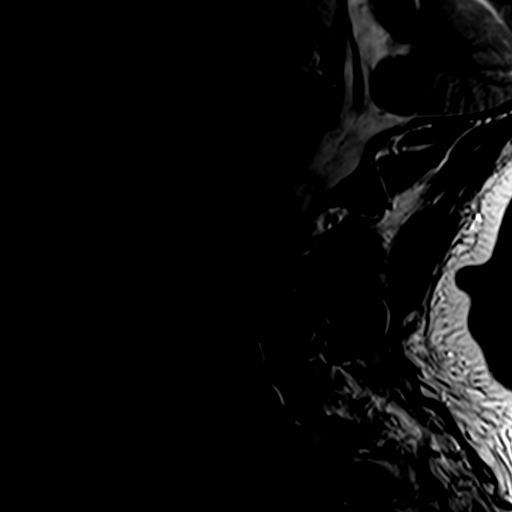
[im 8/13]
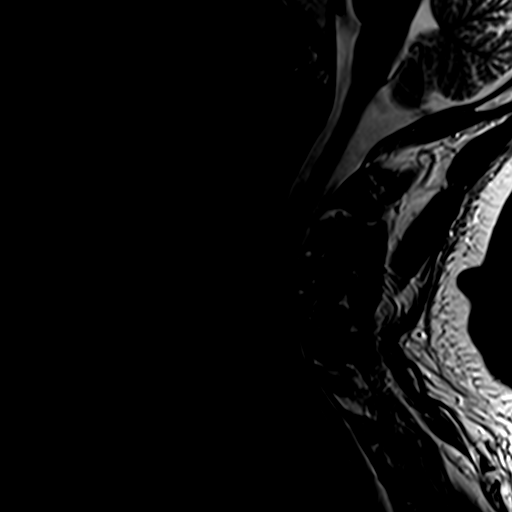
[im 10/13]
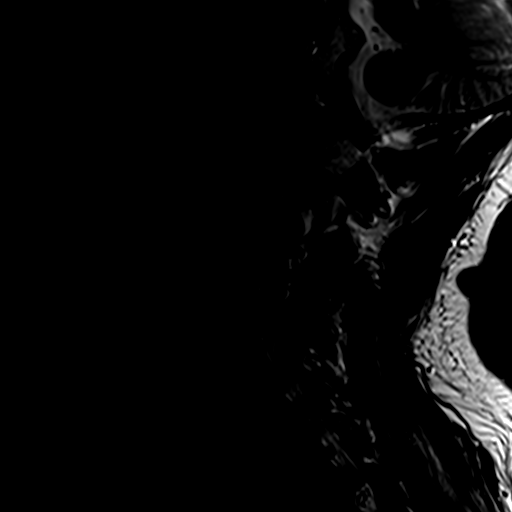
[im 13/13]
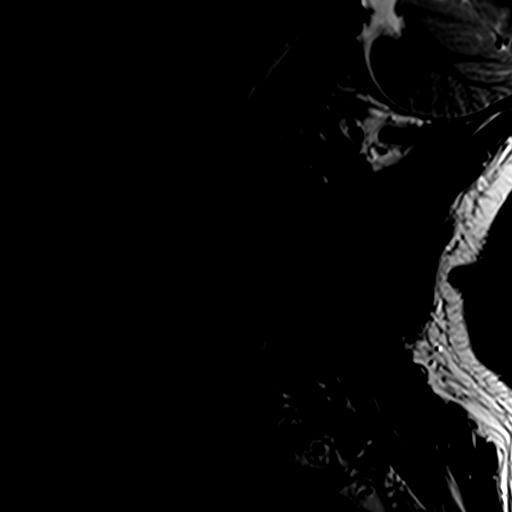

[Series 8: T1 · sagittal · 3.3mm · 0.34mm/px · 6 of 13 slices shown]
[im 1/13]
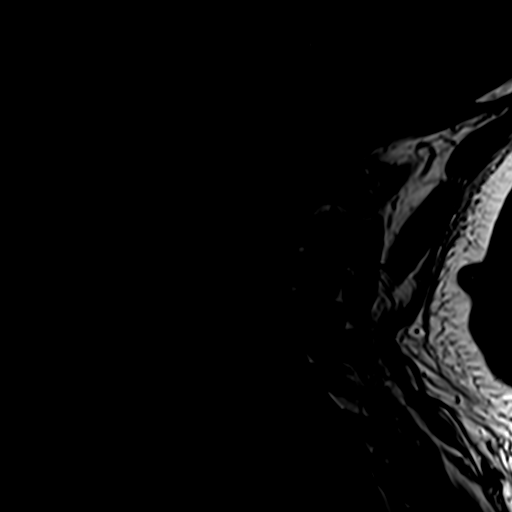
[im 3/13]
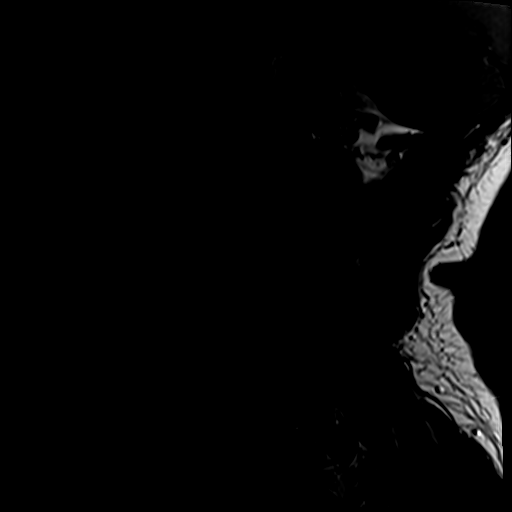
[im 5/13]
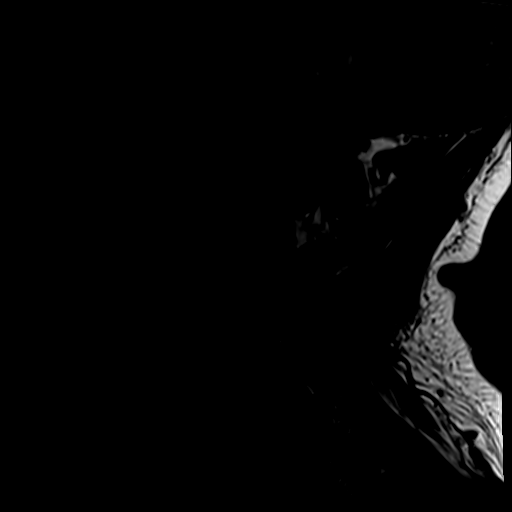
[im 8/13]
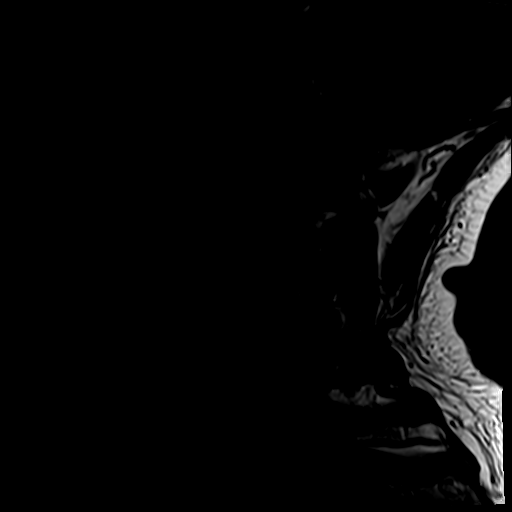
[im 10/13]
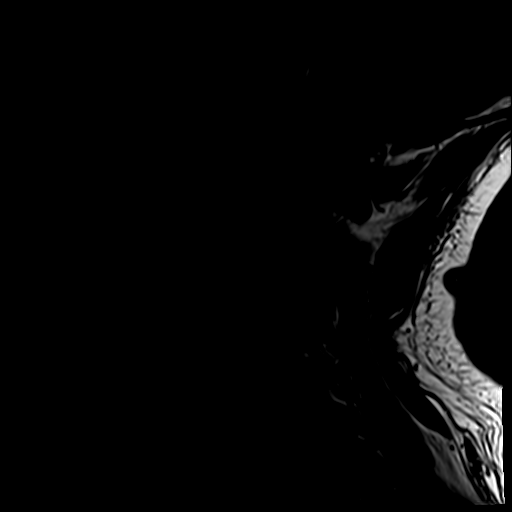
[im 13/13]
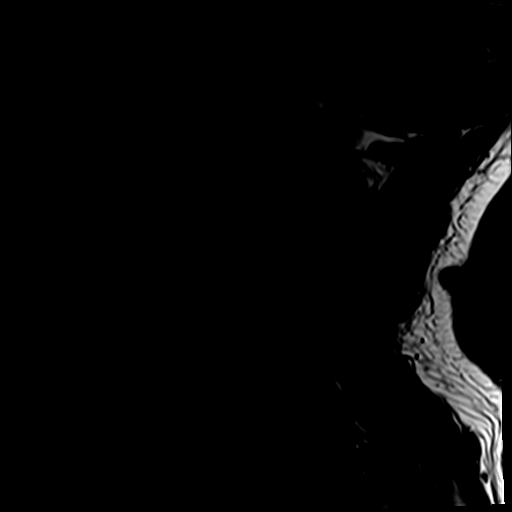

[Series 11: T2 · axial · 3.0mm · 0.70mm/px · z∈[-87,+16]mm · 9 of 30 slices shown (2 of 2)]
[im 1/30]
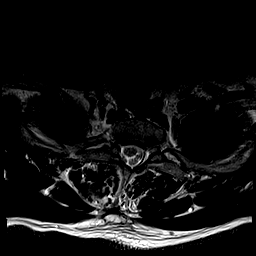
[im 5/30]
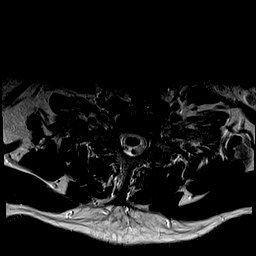
[im 9/30]
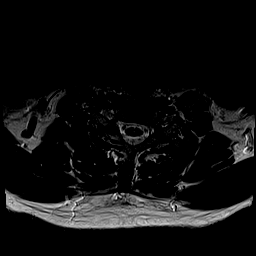
[im 13/30]
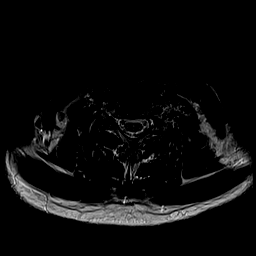
[im 15/30]
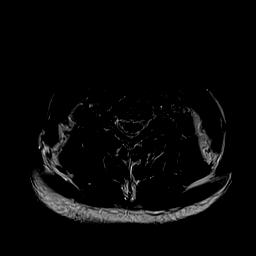
[im 17/30]
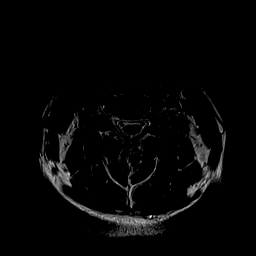
[im 21/30]
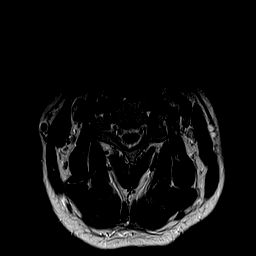
[im 25/30]
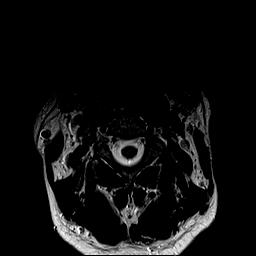
[im 30/30]
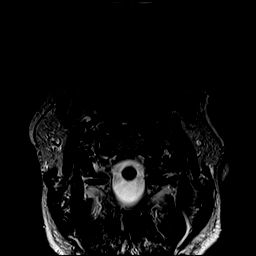

[25 of 48 positions shown; findings below may reference images not displayed]

FINDINGS: Alignment: Maintained. Mild exaggeration of cervical kyphosis noted.

Vertebrae: No fracture or worrisome lesion.

Cord: Normal signal throughout.

Posterior Fossa, vertebral arteries, paraspinal tissues:
Susceptibility artifact the T1 level is consistent with the presence
of spinal fusion hardware in this patient with history of scoliosis.

Disc levels:

C2-3: Negative.

C3-4: Minimal posterior bony ridging without central canal or
foraminal stenosis.

C4-5: Negative.

C5-6: Very shallow disc bulge is identified there is some
uncovertebral spurring on the right. Mild right foraminal narrowing
is seen. The central canal and left foramen are open.

C6-7: Negative.

C7-T1: Negative.
IMPRESSION: Mild right foraminal narrowing at C5-6 due to uncovertebral
spurring. The foramina are otherwise patent at all levels. Negative
for central canal stenosis.

## 2019-01-20 DIAGNOSIS — J0100 Acute maxillary sinusitis, unspecified: Secondary | ICD-10-CM | POA: Diagnosis not present

## 2019-01-29 ENCOUNTER — Encounter: Payer: Self-pay | Admitting: Gastroenterology

## 2019-02-16 ENCOUNTER — Encounter: Payer: BLUE CROSS/BLUE SHIELD | Admitting: Gastroenterology

## 2019-02-18 ENCOUNTER — Encounter: Payer: Self-pay | Admitting: Gastroenterology

## 2019-02-19 DIAGNOSIS — J019 Acute sinusitis, unspecified: Secondary | ICD-10-CM | POA: Diagnosis not present

## 2019-02-19 DIAGNOSIS — M545 Low back pain: Secondary | ICD-10-CM | POA: Diagnosis not present

## 2019-03-16 DIAGNOSIS — G894 Chronic pain syndrome: Secondary | ICD-10-CM | POA: Diagnosis not present

## 2019-03-16 DIAGNOSIS — J301 Allergic rhinitis due to pollen: Secondary | ICD-10-CM | POA: Diagnosis not present

## 2019-05-12 DIAGNOSIS — M545 Low back pain: Secondary | ICD-10-CM | POA: Diagnosis not present

## 2019-05-12 DIAGNOSIS — J0101 Acute recurrent maxillary sinusitis: Secondary | ICD-10-CM | POA: Diagnosis not present

## 2019-05-12 DIAGNOSIS — G894 Chronic pain syndrome: Secondary | ICD-10-CM | POA: Diagnosis not present

## 2019-06-10 DIAGNOSIS — F419 Anxiety disorder, unspecified: Secondary | ICD-10-CM | POA: Diagnosis not present

## 2019-06-10 DIAGNOSIS — M545 Low back pain: Secondary | ICD-10-CM | POA: Diagnosis not present

## 2019-06-10 DIAGNOSIS — F324 Major depressive disorder, single episode, in partial remission: Secondary | ICD-10-CM | POA: Diagnosis not present

## 2019-06-10 DIAGNOSIS — G894 Chronic pain syndrome: Secondary | ICD-10-CM | POA: Diagnosis not present

## 2019-07-15 DIAGNOSIS — E7800 Pure hypercholesterolemia, unspecified: Secondary | ICD-10-CM | POA: Diagnosis not present

## 2019-07-15 DIAGNOSIS — M545 Low back pain: Secondary | ICD-10-CM | POA: Diagnosis not present

## 2019-07-15 DIAGNOSIS — G894 Chronic pain syndrome: Secondary | ICD-10-CM | POA: Diagnosis not present

## 2019-07-15 DIAGNOSIS — F324 Major depressive disorder, single episode, in partial remission: Secondary | ICD-10-CM | POA: Diagnosis not present

## 2019-09-29 DIAGNOSIS — E7800 Pure hypercholesterolemia, unspecified: Secondary | ICD-10-CM | POA: Diagnosis not present

## 2019-09-29 DIAGNOSIS — F419 Anxiety disorder, unspecified: Secondary | ICD-10-CM | POA: Diagnosis not present

## 2019-09-29 DIAGNOSIS — F324 Major depressive disorder, single episode, in partial remission: Secondary | ICD-10-CM | POA: Diagnosis not present

## 2019-09-29 DIAGNOSIS — G894 Chronic pain syndrome: Secondary | ICD-10-CM | POA: Diagnosis not present

## 2019-11-04 DIAGNOSIS — M542 Cervicalgia: Secondary | ICD-10-CM | POA: Diagnosis not present

## 2019-11-04 DIAGNOSIS — G894 Chronic pain syndrome: Secondary | ICD-10-CM | POA: Diagnosis not present

## 2019-11-16 DIAGNOSIS — J0100 Acute maxillary sinusitis, unspecified: Secondary | ICD-10-CM | POA: Diagnosis not present

## 2020-04-04 DIAGNOSIS — G894 Chronic pain syndrome: Secondary | ICD-10-CM | POA: Diagnosis not present

## 2020-04-04 DIAGNOSIS — M545 Low back pain: Secondary | ICD-10-CM | POA: Diagnosis not present

## 2020-04-27 DIAGNOSIS — H401113 Primary open-angle glaucoma, right eye, severe stage: Secondary | ICD-10-CM | POA: Diagnosis not present

## 2020-04-27 DIAGNOSIS — H547 Unspecified visual loss: Secondary | ICD-10-CM | POA: Diagnosis not present

## 2020-04-27 DIAGNOSIS — G894 Chronic pain syndrome: Secondary | ICD-10-CM | POA: Diagnosis not present

## 2020-04-27 DIAGNOSIS — H40052 Ocular hypertension, left eye: Secondary | ICD-10-CM | POA: Diagnosis not present

## 2020-04-27 DIAGNOSIS — F419 Anxiety disorder, unspecified: Secondary | ICD-10-CM | POA: Diagnosis not present

## 2020-04-27 DIAGNOSIS — F324 Major depressive disorder, single episode, in partial remission: Secondary | ICD-10-CM | POA: Diagnosis not present

## 2020-05-24 DIAGNOSIS — F419 Anxiety disorder, unspecified: Secondary | ICD-10-CM | POA: Diagnosis not present

## 2020-05-24 DIAGNOSIS — M545 Low back pain: Secondary | ICD-10-CM | POA: Diagnosis not present

## 2020-05-24 DIAGNOSIS — G894 Chronic pain syndrome: Secondary | ICD-10-CM | POA: Diagnosis not present

## 2020-05-24 DIAGNOSIS — F324 Major depressive disorder, single episode, in partial remission: Secondary | ICD-10-CM | POA: Diagnosis not present

## 2020-11-01 DIAGNOSIS — F324 Major depressive disorder, single episode, in partial remission: Secondary | ICD-10-CM | POA: Diagnosis not present

## 2020-11-01 DIAGNOSIS — I10 Essential (primary) hypertension: Secondary | ICD-10-CM | POA: Diagnosis not present

## 2020-11-01 DIAGNOSIS — E7800 Pure hypercholesterolemia, unspecified: Secondary | ICD-10-CM | POA: Diagnosis not present

## 2020-11-01 DIAGNOSIS — G894 Chronic pain syndrome: Secondary | ICD-10-CM | POA: Diagnosis not present

## 2021-01-30 DIAGNOSIS — L821 Other seborrheic keratosis: Secondary | ICD-10-CM | POA: Diagnosis not present

## 2021-01-30 DIAGNOSIS — G894 Chronic pain syndrome: Secondary | ICD-10-CM | POA: Diagnosis not present

## 2021-05-10 DIAGNOSIS — F419 Anxiety disorder, unspecified: Secondary | ICD-10-CM | POA: Diagnosis not present

## 2021-05-10 DIAGNOSIS — F324 Major depressive disorder, single episode, in partial remission: Secondary | ICD-10-CM | POA: Diagnosis not present

## 2021-05-10 DIAGNOSIS — I10 Essential (primary) hypertension: Secondary | ICD-10-CM | POA: Diagnosis not present

## 2021-05-10 DIAGNOSIS — G894 Chronic pain syndrome: Secondary | ICD-10-CM | POA: Diagnosis not present

## 2021-08-10 DIAGNOSIS — F419 Anxiety disorder, unspecified: Secondary | ICD-10-CM | POA: Diagnosis not present

## 2021-08-10 DIAGNOSIS — G894 Chronic pain syndrome: Secondary | ICD-10-CM | POA: Diagnosis not present

## 2021-08-10 DIAGNOSIS — E7800 Pure hypercholesterolemia, unspecified: Secondary | ICD-10-CM | POA: Diagnosis not present

## 2021-08-10 DIAGNOSIS — F324 Major depressive disorder, single episode, in partial remission: Secondary | ICD-10-CM | POA: Diagnosis not present

## 2021-11-14 DIAGNOSIS — F331 Major depressive disorder, recurrent, moderate: Secondary | ICD-10-CM | POA: Diagnosis not present

## 2021-11-20 DIAGNOSIS — F331 Major depressive disorder, recurrent, moderate: Secondary | ICD-10-CM | POA: Diagnosis not present

## 2021-11-28 DIAGNOSIS — F331 Major depressive disorder, recurrent, moderate: Secondary | ICD-10-CM | POA: Diagnosis not present

## 2021-12-04 DIAGNOSIS — F331 Major depressive disorder, recurrent, moderate: Secondary | ICD-10-CM | POA: Diagnosis not present

## 2021-12-05 DIAGNOSIS — I10 Essential (primary) hypertension: Secondary | ICD-10-CM | POA: Diagnosis not present

## 2021-12-05 DIAGNOSIS — F419 Anxiety disorder, unspecified: Secondary | ICD-10-CM | POA: Diagnosis not present

## 2021-12-05 DIAGNOSIS — E7800 Pure hypercholesterolemia, unspecified: Secondary | ICD-10-CM | POA: Diagnosis not present

## 2021-12-05 DIAGNOSIS — G894 Chronic pain syndrome: Secondary | ICD-10-CM | POA: Diagnosis not present

## 2021-12-11 DIAGNOSIS — F331 Major depressive disorder, recurrent, moderate: Secondary | ICD-10-CM | POA: Diagnosis not present

## 2021-12-18 DIAGNOSIS — F331 Major depressive disorder, recurrent, moderate: Secondary | ICD-10-CM | POA: Diagnosis not present

## 2021-12-26 DIAGNOSIS — F331 Major depressive disorder, recurrent, moderate: Secondary | ICD-10-CM | POA: Diagnosis not present

## 2022-01-01 DIAGNOSIS — F331 Major depressive disorder, recurrent, moderate: Secondary | ICD-10-CM | POA: Diagnosis not present

## 2022-01-08 DIAGNOSIS — F331 Major depressive disorder, recurrent, moderate: Secondary | ICD-10-CM | POA: Diagnosis not present

## 2022-01-15 DIAGNOSIS — F331 Major depressive disorder, recurrent, moderate: Secondary | ICD-10-CM | POA: Diagnosis not present

## 2022-01-22 DIAGNOSIS — F331 Major depressive disorder, recurrent, moderate: Secondary | ICD-10-CM | POA: Diagnosis not present

## 2022-02-05 DIAGNOSIS — F331 Major depressive disorder, recurrent, moderate: Secondary | ICD-10-CM | POA: Diagnosis not present

## 2022-02-19 DIAGNOSIS — F331 Major depressive disorder, recurrent, moderate: Secondary | ICD-10-CM | POA: Diagnosis not present

## 2022-02-26 DIAGNOSIS — F331 Major depressive disorder, recurrent, moderate: Secondary | ICD-10-CM | POA: Diagnosis not present

## 2022-03-06 DIAGNOSIS — G894 Chronic pain syndrome: Secondary | ICD-10-CM | POA: Diagnosis not present

## 2022-03-06 DIAGNOSIS — I10 Essential (primary) hypertension: Secondary | ICD-10-CM | POA: Diagnosis not present

## 2022-03-06 DIAGNOSIS — E7800 Pure hypercholesterolemia, unspecified: Secondary | ICD-10-CM | POA: Diagnosis not present

## 2022-03-06 DIAGNOSIS — F419 Anxiety disorder, unspecified: Secondary | ICD-10-CM | POA: Diagnosis not present

## 2022-03-12 DIAGNOSIS — F331 Major depressive disorder, recurrent, moderate: Secondary | ICD-10-CM | POA: Diagnosis not present

## 2022-03-19 DIAGNOSIS — F331 Major depressive disorder, recurrent, moderate: Secondary | ICD-10-CM | POA: Diagnosis not present

## 2022-03-27 DIAGNOSIS — F331 Major depressive disorder, recurrent, moderate: Secondary | ICD-10-CM | POA: Diagnosis not present

## 2022-04-02 DIAGNOSIS — F331 Major depressive disorder, recurrent, moderate: Secondary | ICD-10-CM | POA: Diagnosis not present

## 2022-04-09 DIAGNOSIS — F331 Major depressive disorder, recurrent, moderate: Secondary | ICD-10-CM | POA: Diagnosis not present

## 2022-04-17 DIAGNOSIS — F331 Major depressive disorder, recurrent, moderate: Secondary | ICD-10-CM | POA: Diagnosis not present

## 2022-04-24 DIAGNOSIS — F331 Major depressive disorder, recurrent, moderate: Secondary | ICD-10-CM | POA: Diagnosis not present

## 2022-05-03 DIAGNOSIS — F331 Major depressive disorder, recurrent, moderate: Secondary | ICD-10-CM | POA: Diagnosis not present

## 2022-05-10 DIAGNOSIS — F331 Major depressive disorder, recurrent, moderate: Secondary | ICD-10-CM | POA: Diagnosis not present

## 2022-05-14 DIAGNOSIS — F331 Major depressive disorder, recurrent, moderate: Secondary | ICD-10-CM | POA: Diagnosis not present

## 2022-05-21 DIAGNOSIS — F331 Major depressive disorder, recurrent, moderate: Secondary | ICD-10-CM | POA: Diagnosis not present

## 2022-05-28 DIAGNOSIS — F331 Major depressive disorder, recurrent, moderate: Secondary | ICD-10-CM | POA: Diagnosis not present

## 2022-06-05 DIAGNOSIS — F331 Major depressive disorder, recurrent, moderate: Secondary | ICD-10-CM | POA: Diagnosis not present

## 2022-06-07 DIAGNOSIS — G894 Chronic pain syndrome: Secondary | ICD-10-CM | POA: Diagnosis not present

## 2022-06-07 DIAGNOSIS — I10 Essential (primary) hypertension: Secondary | ICD-10-CM | POA: Diagnosis not present

## 2022-06-07 DIAGNOSIS — Z125 Encounter for screening for malignant neoplasm of prostate: Secondary | ICD-10-CM | POA: Diagnosis not present

## 2022-06-07 DIAGNOSIS — F419 Anxiety disorder, unspecified: Secondary | ICD-10-CM | POA: Diagnosis not present

## 2022-06-07 DIAGNOSIS — E7800 Pure hypercholesterolemia, unspecified: Secondary | ICD-10-CM | POA: Diagnosis not present

## 2022-06-18 DIAGNOSIS — F331 Major depressive disorder, recurrent, moderate: Secondary | ICD-10-CM | POA: Diagnosis not present

## 2022-07-09 DIAGNOSIS — F331 Major depressive disorder, recurrent, moderate: Secondary | ICD-10-CM | POA: Diagnosis not present

## 2022-07-23 DIAGNOSIS — F331 Major depressive disorder, recurrent, moderate: Secondary | ICD-10-CM | POA: Diagnosis not present

## 2022-08-14 DIAGNOSIS — F331 Major depressive disorder, recurrent, moderate: Secondary | ICD-10-CM | POA: Diagnosis not present

## 2022-08-28 DIAGNOSIS — F331 Major depressive disorder, recurrent, moderate: Secondary | ICD-10-CM | POA: Diagnosis not present

## 2022-09-10 DIAGNOSIS — G894 Chronic pain syndrome: Secondary | ICD-10-CM | POA: Diagnosis not present

## 2022-09-10 DIAGNOSIS — F324 Major depressive disorder, single episode, in partial remission: Secondary | ICD-10-CM | POA: Diagnosis not present

## 2022-09-10 DIAGNOSIS — I10 Essential (primary) hypertension: Secondary | ICD-10-CM | POA: Diagnosis not present

## 2022-09-10 DIAGNOSIS — E7800 Pure hypercholesterolemia, unspecified: Secondary | ICD-10-CM | POA: Diagnosis not present

## 2022-09-25 DIAGNOSIS — F331 Major depressive disorder, recurrent, moderate: Secondary | ICD-10-CM | POA: Diagnosis not present

## 2022-10-08 DIAGNOSIS — F331 Major depressive disorder, recurrent, moderate: Secondary | ICD-10-CM | POA: Diagnosis not present

## 2022-10-22 DIAGNOSIS — F331 Major depressive disorder, recurrent, moderate: Secondary | ICD-10-CM | POA: Diagnosis not present

## 2022-11-05 DIAGNOSIS — F331 Major depressive disorder, recurrent, moderate: Secondary | ICD-10-CM | POA: Diagnosis not present

## 2022-11-13 ENCOUNTER — Other Ambulatory Visit (HOSPITAL_BASED_OUTPATIENT_CLINIC_OR_DEPARTMENT_OTHER): Payer: Self-pay

## 2022-11-13 MED ORDER — HYDROCODONE-ACETAMINOPHEN 10-325 MG PO TABS
1.0000 | ORAL_TABLET | ORAL | 0 refills | Status: AC
  Filled 2022-11-15 (×2): qty 180, 30d supply, fill #0

## 2022-11-14 ENCOUNTER — Other Ambulatory Visit (HOSPITAL_BASED_OUTPATIENT_CLINIC_OR_DEPARTMENT_OTHER): Payer: Self-pay

## 2022-11-15 ENCOUNTER — Other Ambulatory Visit: Payer: Self-pay

## 2022-11-15 ENCOUNTER — Other Ambulatory Visit (HOSPITAL_BASED_OUTPATIENT_CLINIC_OR_DEPARTMENT_OTHER): Payer: Self-pay

## 2022-11-21 DIAGNOSIS — F331 Major depressive disorder, recurrent, moderate: Secondary | ICD-10-CM | POA: Diagnosis not present

## 2022-12-16 DIAGNOSIS — F331 Major depressive disorder, recurrent, moderate: Secondary | ICD-10-CM | POA: Diagnosis not present

## 2022-12-19 DIAGNOSIS — F331 Major depressive disorder, recurrent, moderate: Secondary | ICD-10-CM | POA: Diagnosis not present

## 2022-12-26 DIAGNOSIS — F331 Major depressive disorder, recurrent, moderate: Secondary | ICD-10-CM | POA: Diagnosis not present

## 2023-01-02 DIAGNOSIS — F331 Major depressive disorder, recurrent, moderate: Secondary | ICD-10-CM | POA: Diagnosis not present

## 2023-01-07 DIAGNOSIS — F331 Major depressive disorder, recurrent, moderate: Secondary | ICD-10-CM | POA: Diagnosis not present

## 2023-01-09 DIAGNOSIS — F331 Major depressive disorder, recurrent, moderate: Secondary | ICD-10-CM | POA: Diagnosis not present

## 2023-01-16 DIAGNOSIS — G894 Chronic pain syndrome: Secondary | ICD-10-CM | POA: Diagnosis not present

## 2023-01-16 DIAGNOSIS — F324 Major depressive disorder, single episode, in partial remission: Secondary | ICD-10-CM | POA: Diagnosis not present

## 2023-01-16 DIAGNOSIS — E7800 Pure hypercholesterolemia, unspecified: Secondary | ICD-10-CM | POA: Diagnosis not present

## 2023-01-16 DIAGNOSIS — I10 Essential (primary) hypertension: Secondary | ICD-10-CM | POA: Diagnosis not present

## 2023-01-23 DIAGNOSIS — F331 Major depressive disorder, recurrent, moderate: Secondary | ICD-10-CM | POA: Diagnosis not present

## 2023-02-04 DIAGNOSIS — F331 Major depressive disorder, recurrent, moderate: Secondary | ICD-10-CM | POA: Diagnosis not present

## 2023-02-08 ENCOUNTER — Other Ambulatory Visit (HOSPITAL_BASED_OUTPATIENT_CLINIC_OR_DEPARTMENT_OTHER): Payer: Self-pay

## 2023-02-08 MED ORDER — OXYCODONE HCL 5 MG PO TABS
5.0000 mg | ORAL_TABLET | Freq: Every day | ORAL | 0 refills | Status: AC
  Filled 2023-02-08 – 2023-02-12 (×2): qty 30, 30d supply, fill #0

## 2023-02-10 ENCOUNTER — Other Ambulatory Visit (HOSPITAL_BASED_OUTPATIENT_CLINIC_OR_DEPARTMENT_OTHER): Payer: Self-pay

## 2023-02-11 DIAGNOSIS — F331 Major depressive disorder, recurrent, moderate: Secondary | ICD-10-CM | POA: Diagnosis not present

## 2023-02-12 ENCOUNTER — Other Ambulatory Visit (HOSPITAL_BASED_OUTPATIENT_CLINIC_OR_DEPARTMENT_OTHER): Payer: Self-pay

## 2023-02-13 DIAGNOSIS — F331 Major depressive disorder, recurrent, moderate: Secondary | ICD-10-CM | POA: Diagnosis not present

## 2023-02-20 DIAGNOSIS — F331 Major depressive disorder, recurrent, moderate: Secondary | ICD-10-CM | POA: Diagnosis not present

## 2023-02-25 DIAGNOSIS — F331 Major depressive disorder, recurrent, moderate: Secondary | ICD-10-CM | POA: Diagnosis not present

## 2023-03-04 DIAGNOSIS — F331 Major depressive disorder, recurrent, moderate: Secondary | ICD-10-CM | POA: Diagnosis not present

## 2023-03-19 DIAGNOSIS — F331 Major depressive disorder, recurrent, moderate: Secondary | ICD-10-CM | POA: Diagnosis not present

## 2023-04-02 DIAGNOSIS — F331 Major depressive disorder, recurrent, moderate: Secondary | ICD-10-CM | POA: Diagnosis not present

## 2023-04-08 DIAGNOSIS — F331 Major depressive disorder, recurrent, moderate: Secondary | ICD-10-CM | POA: Diagnosis not present

## 2023-04-16 DIAGNOSIS — F331 Major depressive disorder, recurrent, moderate: Secondary | ICD-10-CM | POA: Diagnosis not present

## 2023-04-23 DIAGNOSIS — F331 Major depressive disorder, recurrent, moderate: Secondary | ICD-10-CM | POA: Diagnosis not present

## 2023-04-30 DIAGNOSIS — F331 Major depressive disorder, recurrent, moderate: Secondary | ICD-10-CM | POA: Diagnosis not present

## 2023-05-15 DIAGNOSIS — F331 Major depressive disorder, recurrent, moderate: Secondary | ICD-10-CM | POA: Diagnosis not present

## 2023-05-20 DIAGNOSIS — F324 Major depressive disorder, single episode, in partial remission: Secondary | ICD-10-CM | POA: Diagnosis not present

## 2023-05-20 DIAGNOSIS — E7800 Pure hypercholesterolemia, unspecified: Secondary | ICD-10-CM | POA: Diagnosis not present

## 2023-05-20 DIAGNOSIS — I10 Essential (primary) hypertension: Secondary | ICD-10-CM | POA: Diagnosis not present

## 2023-05-20 DIAGNOSIS — G894 Chronic pain syndrome: Secondary | ICD-10-CM | POA: Diagnosis not present

## 2023-05-28 DIAGNOSIS — F331 Major depressive disorder, recurrent, moderate: Secondary | ICD-10-CM | POA: Diagnosis not present

## 2023-06-10 DIAGNOSIS — F331 Major depressive disorder, recurrent, moderate: Secondary | ICD-10-CM | POA: Diagnosis not present

## 2023-06-24 DIAGNOSIS — F331 Major depressive disorder, recurrent, moderate: Secondary | ICD-10-CM | POA: Diagnosis not present

## 2023-07-08 DIAGNOSIS — F331 Major depressive disorder, recurrent, moderate: Secondary | ICD-10-CM | POA: Diagnosis not present

## 2023-07-23 DIAGNOSIS — F331 Major depressive disorder, recurrent, moderate: Secondary | ICD-10-CM | POA: Diagnosis not present

## 2023-08-06 DIAGNOSIS — F331 Major depressive disorder, recurrent, moderate: Secondary | ICD-10-CM | POA: Diagnosis not present

## 2023-08-12 DIAGNOSIS — F331 Major depressive disorder, recurrent, moderate: Secondary | ICD-10-CM | POA: Diagnosis not present

## 2023-08-19 DIAGNOSIS — F331 Major depressive disorder, recurrent, moderate: Secondary | ICD-10-CM | POA: Diagnosis not present

## 2023-08-26 DIAGNOSIS — F331 Major depressive disorder, recurrent, moderate: Secondary | ICD-10-CM | POA: Diagnosis not present

## 2023-09-02 DIAGNOSIS — F331 Major depressive disorder, recurrent, moderate: Secondary | ICD-10-CM | POA: Diagnosis not present

## 2023-09-04 DIAGNOSIS — I10 Essential (primary) hypertension: Secondary | ICD-10-CM | POA: Diagnosis not present

## 2023-09-04 DIAGNOSIS — G894 Chronic pain syndrome: Secondary | ICD-10-CM | POA: Diagnosis not present

## 2023-09-04 DIAGNOSIS — E7800 Pure hypercholesterolemia, unspecified: Secondary | ICD-10-CM | POA: Diagnosis not present

## 2023-09-04 DIAGNOSIS — F324 Major depressive disorder, single episode, in partial remission: Secondary | ICD-10-CM | POA: Diagnosis not present

## 2023-09-04 DIAGNOSIS — Z125 Encounter for screening for malignant neoplasm of prostate: Secondary | ICD-10-CM | POA: Diagnosis not present

## 2023-09-09 DIAGNOSIS — R000 Tachycardia, unspecified: Secondary | ICD-10-CM | POA: Diagnosis not present

## 2023-09-09 DIAGNOSIS — R1013 Epigastric pain: Secondary | ICD-10-CM | POA: Diagnosis not present

## 2023-09-09 DIAGNOSIS — R1084 Generalized abdominal pain: Secondary | ICD-10-CM | POA: Diagnosis not present

## 2023-09-09 DIAGNOSIS — R112 Nausea with vomiting, unspecified: Secondary | ICD-10-CM | POA: Diagnosis not present

## 2023-09-18 DIAGNOSIS — F331 Major depressive disorder, recurrent, moderate: Secondary | ICD-10-CM | POA: Diagnosis not present

## 2023-09-23 DIAGNOSIS — F331 Major depressive disorder, recurrent, moderate: Secondary | ICD-10-CM | POA: Diagnosis not present

## 2023-09-30 DIAGNOSIS — F331 Major depressive disorder, recurrent, moderate: Secondary | ICD-10-CM | POA: Diagnosis not present

## 2023-10-07 DIAGNOSIS — F331 Major depressive disorder, recurrent, moderate: Secondary | ICD-10-CM | POA: Diagnosis not present

## 2023-10-14 DIAGNOSIS — F331 Major depressive disorder, recurrent, moderate: Secondary | ICD-10-CM | POA: Diagnosis not present

## 2023-10-23 DIAGNOSIS — F331 Major depressive disorder, recurrent, moderate: Secondary | ICD-10-CM | POA: Diagnosis not present

## 2023-10-28 DIAGNOSIS — F331 Major depressive disorder, recurrent, moderate: Secondary | ICD-10-CM | POA: Diagnosis not present

## 2023-11-11 DIAGNOSIS — F331 Major depressive disorder, recurrent, moderate: Secondary | ICD-10-CM | POA: Diagnosis not present

## 2023-12-02 DIAGNOSIS — F331 Major depressive disorder, recurrent, moderate: Secondary | ICD-10-CM | POA: Diagnosis not present

## 2023-12-10 DIAGNOSIS — F331 Major depressive disorder, recurrent, moderate: Secondary | ICD-10-CM | POA: Diagnosis not present

## 2023-12-25 DIAGNOSIS — F331 Major depressive disorder, recurrent, moderate: Secondary | ICD-10-CM | POA: Diagnosis not present

## 2024-01-02 ENCOUNTER — Other Ambulatory Visit (HOSPITAL_BASED_OUTPATIENT_CLINIC_OR_DEPARTMENT_OTHER): Payer: Self-pay

## 2024-01-02 DIAGNOSIS — G894 Chronic pain syndrome: Secondary | ICD-10-CM | POA: Diagnosis not present

## 2024-01-02 DIAGNOSIS — E7800 Pure hypercholesterolemia, unspecified: Secondary | ICD-10-CM | POA: Diagnosis not present

## 2024-01-02 DIAGNOSIS — F331 Major depressive disorder, recurrent, moderate: Secondary | ICD-10-CM | POA: Diagnosis not present

## 2024-01-02 DIAGNOSIS — F324 Major depressive disorder, single episode, in partial remission: Secondary | ICD-10-CM | POA: Diagnosis not present

## 2024-01-02 DIAGNOSIS — I10 Essential (primary) hypertension: Secondary | ICD-10-CM | POA: Diagnosis not present

## 2024-01-13 DIAGNOSIS — F331 Major depressive disorder, recurrent, moderate: Secondary | ICD-10-CM | POA: Diagnosis not present

## 2024-01-23 DIAGNOSIS — F331 Major depressive disorder, recurrent, moderate: Secondary | ICD-10-CM | POA: Diagnosis not present

## 2024-02-03 DIAGNOSIS — F331 Major depressive disorder, recurrent, moderate: Secondary | ICD-10-CM | POA: Diagnosis not present

## 2024-02-17 DIAGNOSIS — F331 Major depressive disorder, recurrent, moderate: Secondary | ICD-10-CM | POA: Diagnosis not present

## 2024-02-26 DIAGNOSIS — F331 Major depressive disorder, recurrent, moderate: Secondary | ICD-10-CM | POA: Diagnosis not present

## 2024-03-09 DIAGNOSIS — E7800 Pure hypercholesterolemia, unspecified: Secondary | ICD-10-CM | POA: Diagnosis not present

## 2024-03-09 DIAGNOSIS — I10 Essential (primary) hypertension: Secondary | ICD-10-CM | POA: Diagnosis not present

## 2024-03-09 DIAGNOSIS — F324 Major depressive disorder, single episode, in partial remission: Secondary | ICD-10-CM | POA: Diagnosis not present

## 2024-03-09 DIAGNOSIS — G894 Chronic pain syndrome: Secondary | ICD-10-CM | POA: Diagnosis not present

## 2024-04-07 DIAGNOSIS — F331 Major depressive disorder, recurrent, moderate: Secondary | ICD-10-CM | POA: Diagnosis not present

## 2024-04-20 DIAGNOSIS — F331 Major depressive disorder, recurrent, moderate: Secondary | ICD-10-CM | POA: Diagnosis not present

## 2024-04-27 DIAGNOSIS — H401134 Primary open-angle glaucoma, bilateral, indeterminate stage: Secondary | ICD-10-CM | POA: Diagnosis not present

## 2024-04-27 DIAGNOSIS — H25813 Combined forms of age-related cataract, bilateral: Secondary | ICD-10-CM | POA: Diagnosis not present

## 2024-04-27 DIAGNOSIS — H526 Other disorders of refraction: Secondary | ICD-10-CM | POA: Diagnosis not present

## 2024-05-18 DIAGNOSIS — H401121 Primary open-angle glaucoma, left eye, mild stage: Secondary | ICD-10-CM | POA: Diagnosis not present

## 2024-05-18 DIAGNOSIS — H401112 Primary open-angle glaucoma, right eye, moderate stage: Secondary | ICD-10-CM | POA: Diagnosis not present

## 2024-06-15 DIAGNOSIS — H401121 Primary open-angle glaucoma, left eye, mild stage: Secondary | ICD-10-CM | POA: Diagnosis not present

## 2024-06-15 DIAGNOSIS — H401112 Primary open-angle glaucoma, right eye, moderate stage: Secondary | ICD-10-CM | POA: Diagnosis not present

## 2024-06-17 DIAGNOSIS — F331 Major depressive disorder, recurrent, moderate: Secondary | ICD-10-CM | POA: Diagnosis not present

## 2024-06-29 DIAGNOSIS — F331 Major depressive disorder, recurrent, moderate: Secondary | ICD-10-CM | POA: Diagnosis not present

## 2024-07-05 DIAGNOSIS — I10 Essential (primary) hypertension: Secondary | ICD-10-CM | POA: Diagnosis not present

## 2024-07-05 DIAGNOSIS — E7800 Pure hypercholesterolemia, unspecified: Secondary | ICD-10-CM | POA: Diagnosis not present

## 2024-07-05 DIAGNOSIS — F324 Major depressive disorder, single episode, in partial remission: Secondary | ICD-10-CM | POA: Diagnosis not present

## 2024-07-05 DIAGNOSIS — G894 Chronic pain syndrome: Secondary | ICD-10-CM | POA: Diagnosis not present

## 2024-07-20 DIAGNOSIS — H401112 Primary open-angle glaucoma, right eye, moderate stage: Secondary | ICD-10-CM | POA: Diagnosis not present

## 2024-07-20 DIAGNOSIS — H401121 Primary open-angle glaucoma, left eye, mild stage: Secondary | ICD-10-CM | POA: Diagnosis not present

## 2024-10-05 DIAGNOSIS — E7800 Pure hypercholesterolemia, unspecified: Secondary | ICD-10-CM | POA: Diagnosis not present

## 2024-10-05 DIAGNOSIS — F5101 Primary insomnia: Secondary | ICD-10-CM | POA: Diagnosis not present

## 2024-10-05 DIAGNOSIS — G894 Chronic pain syndrome: Secondary | ICD-10-CM | POA: Diagnosis not present

## 2024-10-05 DIAGNOSIS — Z125 Encounter for screening for malignant neoplasm of prostate: Secondary | ICD-10-CM | POA: Diagnosis not present

## 2024-10-05 DIAGNOSIS — I10 Essential (primary) hypertension: Secondary | ICD-10-CM | POA: Diagnosis not present

## 2024-10-05 DIAGNOSIS — S96912A Strain of unspecified muscle and tendon at ankle and foot level, left foot, initial encounter: Secondary | ICD-10-CM | POA: Diagnosis not present
